# Patient Record
Sex: Female | Born: 1975 | Race: White | Hispanic: No | Marital: Married
Health system: Southern US, Community
[De-identification: ages and names within clinical notes are randomized; demographics above are authoritative.]

---

## 2008-09-17 ENCOUNTER — Ambulatory Visit: Payer: Self-pay | Admitting: Family Medicine

## 2008-09-17 DIAGNOSIS — J019 Acute sinusitis, unspecified: Secondary | ICD-10-CM | POA: Insufficient documentation

## 2017-06-18 ENCOUNTER — Emergency Department
Admission: EM | Admit: 2017-06-18 | Discharge: 2017-06-18 | Disposition: A | Discharge status: Home / Self Care | Payer: Managed Care, Other (non HMO) | Source: Home / Self Care | Attending: Family Medicine | Admitting: Family Medicine

## 2017-06-18 ENCOUNTER — Emergency Department (INDEPENDENT_AMBULATORY_CARE_PROVIDER_SITE_OTHER): Payer: Managed Care, Other (non HMO)

## 2017-06-18 DIAGNOSIS — M25571 Pain in right ankle and joints of right foot: Secondary | ICD-10-CM | POA: Diagnosis not present

## 2017-06-18 DIAGNOSIS — S93601A Unspecified sprain of right foot, initial encounter: Secondary | ICD-10-CM

## 2017-06-18 DIAGNOSIS — M79671 Pain in right foot: Secondary | ICD-10-CM

## 2017-06-18 NOTE — ED Triage Notes (Signed)
Patient presents to [facility] with a complaint of foot and ankle pain since yesterday after falling down a few stairs

## 2017-06-18 NOTE — ED Provider Notes (Signed)
[facility]    CSN: [id number] Arrival date & time: 06/18/17  1135     History   Chief Complaint Chief Complaint  Patient presents with  . Foot Pain    HPI Vanessa Dawson is a 41 y.o. female.   While walking down stairs yesterday, patient missed the bottom two stairs and came down forcefully on her right foot.  She has had persistent pain in her right forefoot.   The history is provided by the patient.  Foot Pain  This is a new problem. The current episode started yesterday. The problem occurs constantly. The problem has not changed since onset.Associated symptoms comments: none. The symptoms are aggravated by walking and standing. Nothing relieves the symptoms. She has tried nothing for the symptoms.    No past medical history on file.  Patient Active Problem List   Diagnosis Date Noted  . ACUTE SINUSITIS, UNSPECIFIED 09/17/2008    No past surgical history on file.  OB History    No data available       Home Medications    Prior to Admission medications   Not on File    Family History No family history on file.  Social History Social History  Substance Use Topics  . Smoking status: Not on file  . Smokeless tobacco: Not on file  . Alcohol use Not on file     Allergies   Acetaminophen-codeine and Sulfa antibiotics   Review of Systems Review of Systems  All other systems reviewed and are negative.    Physical Exam Triage Vital Signs ED Triage Vitals  Enc Vitals Group     BP 06/18/17 1201 96/62     Pulse Rate 06/18/17 1201 97     Resp --      Temp 06/18/17 1201 97.9 F (36.6 C)     Temp Source 06/18/17 1201 Oral     SpO2 06/18/17 1201 98 %     Weight 06/18/17 1201 195 lb (88.5 kg)     Height 06/18/17 1201 5\' 3"  (1.6 m)     Head Circumference --      Peak Flow --      Pain Score 06/18/17 1202 6     Pain Loc --      Pain Edu? --      Excl. in GC? --    No data found.   Updated Vital Signs BP 96/62 (BP Location: Left  Arm)   Pulse 97   Temp 97.9 F (36.6 C) (Oral)   Ht 5\' 3"  (1.6 m)   Wt 195 lb (88.5 kg)   SpO2 98%   BMI 34.54 kg/m   Visual Acuity Right Eye Distance:   Left Eye Distance:   Bilateral Distance:    Right Eye Near:   Left Eye Near:    Bilateral Near:     Physical Exam  Constitutional: She appears well-developed and well-nourished. No distress.  HENT:  Head: Atraumatic.  Right Ear: External ear normal.  Left Ear: External ear normal.  Eyes: Pupils are equal, round, and reactive to light. Conjunctivae are normal.  Cardiovascular: Normal rate.   Pulmonary/Chest: Effort normal.  Musculoskeletal: She exhibits no edema.       Right foot: There is decreased range of motion, tenderness and bony tenderness. There is no swelling, normal capillary refill, no crepitus, no deformity and no laceration.       Feet:  Right dorsal mid-foot has tenderness to palpation as noted on diagram.  No ankle  pain/tenderness or decreased range of motion   Neurological: She is alert.  Skin: Skin is warm and dry.  Nursing note and vitals reviewed.    UC Treatments / Results  Labs (all labs ordered are listed, but only abnormal results are displayed) Labs Reviewed - No data to display  EKG  EKG Interpretation None       Radiology Dg Ankle Complete Right  Result Date: 06/18/2017 CLINICAL DATA:  Ankle pain after injury yesterday. EXAM: RIGHT ANKLE - COMPLETE 3+ VIEW COMPARISON:  None. FINDINGS: Soft tissue swelling in the lateral right tarsal region. No fracture or subluxation. Small Achilles right calcaneal spur. No appreciable arthropathy. No suspicious focal osseous lesion. No radiopaque foreign body. IMPRESSION: No right ankle fracture or subluxation. Electronically Signed   By: Delbert Phenix M.D.   On: 06/18/2017 12:27   Dg Foot Complete Right  Result Date: 06/18/2017 CLINICAL DATA:  Right foot pain after injury yesterday EXAM: RIGHT FOOT COMPLETE - 3+ VIEW COMPARISON:  None. FINDINGS: No  fracture or dislocation. No suspicious focal osseous lesion. No appreciable arthropathy. Small Achilles right calcaneal spur. No radiopaque foreign body. IMPRESSION: No right foot fracture or malalignment. Electronically Signed   By: Delbert Phenix M.D.   On: 06/18/2017 12:25    Procedures Procedures (including critical care time)  Medications Ordered in UC Medications - No data to display   Initial Impression / Assessment and Plan / UC Course  I have reviewed the triage vital signs and the nursing notes.  Pertinent labs & imaging results that were available during my care of the patient were reviewed by me and considered in my medical decision making (see chart for details).    Ace wrap applied.  Patient declines crutches. Apply ice pack for 30 minutes every 1 to 2 hours today and tomorrow.  Elevate.  Wear Ace wrap until swelling decreases.    Begin range of motion and stretching exercises in about 5 days as per instruction sheet.  May take Ibuprofen 200mg , 4 tabs every 8 hours with food.  Followup with Dr. Rodney Langton or Dr. Clementeen Graham ([facility]) if not improving about two weeks.     Final Clinical Impressions(s) / UC Diagnoses   Final diagnoses:  Sprain of right foot, initial encounter    New Prescriptions New Prescriptions   No medications on file       Lattie Haw, MD 06/18/17 1246

## 2017-06-18 NOTE — Discharge Instructions (Signed)
Apply ice pack for 30 minutes every 1 to 2 hours today and tomorrow.  Elevate. Wear Ace wrap until swelling decreases.  Begin range of motion and stretching exercises in about 5 days as per instruction sheet.  May take Ibuprofen 200mg, 4 tabs every 8 hours with food.  °

## 2018-07-04 IMAGING — DX DG ANKLE COMPLETE 3+V*R*
3 series · 3 of 3 positions shown · non-contrast
Comparison: None.

CLINICAL DATA: Ankle pain after injury yesterday.

EXAM:
RIGHT ANKLE - COMPLETE 3+ VIEW

[ankle ap]
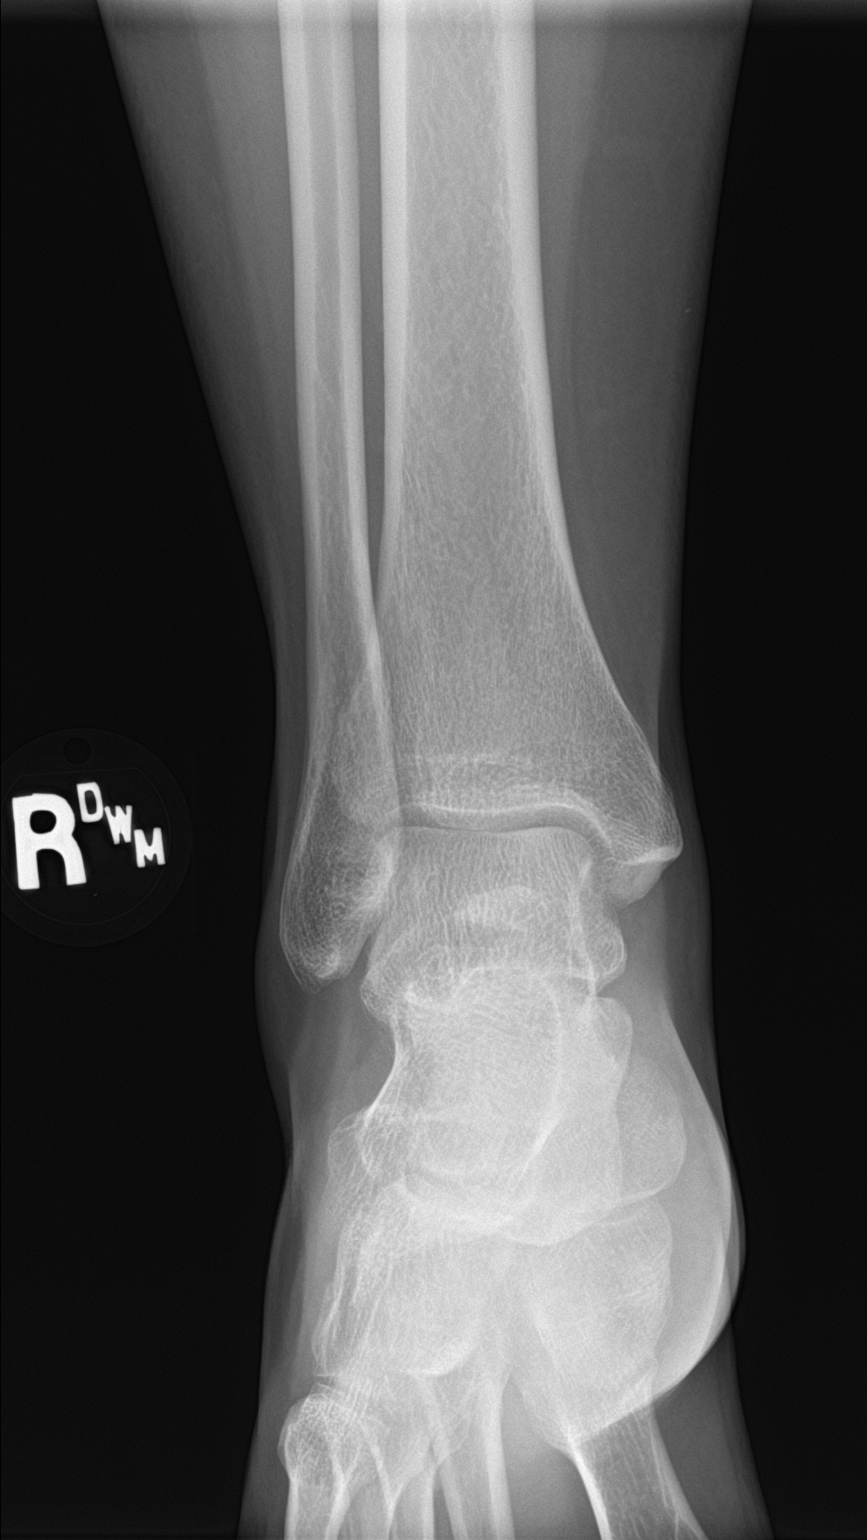

[ankle obl]
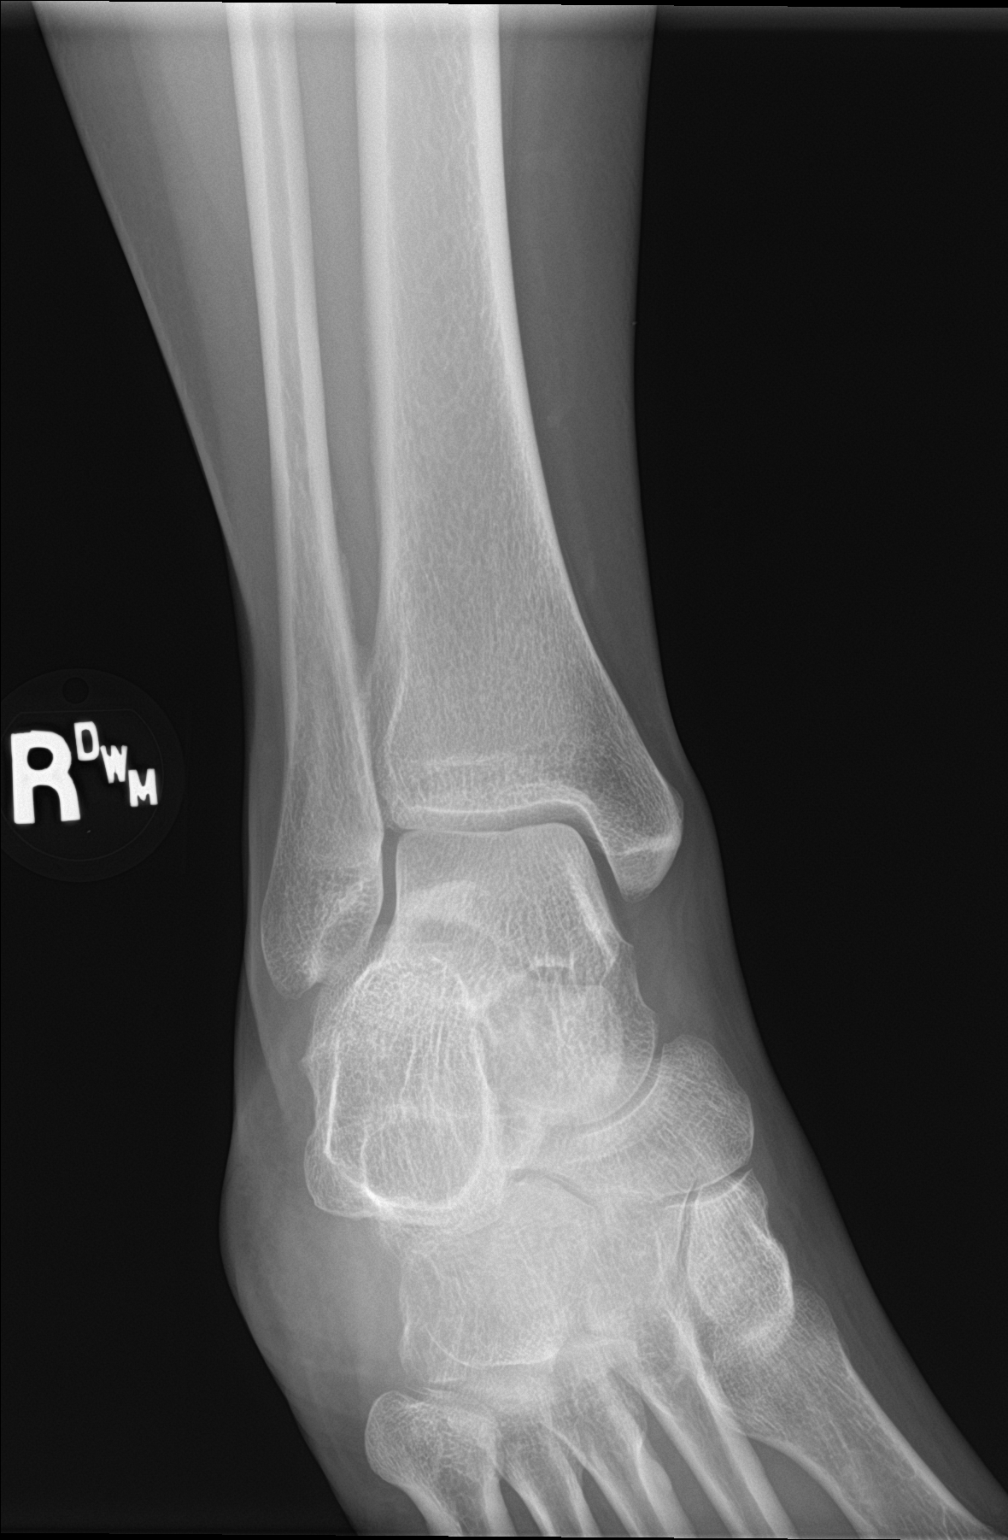

[ankle lat]
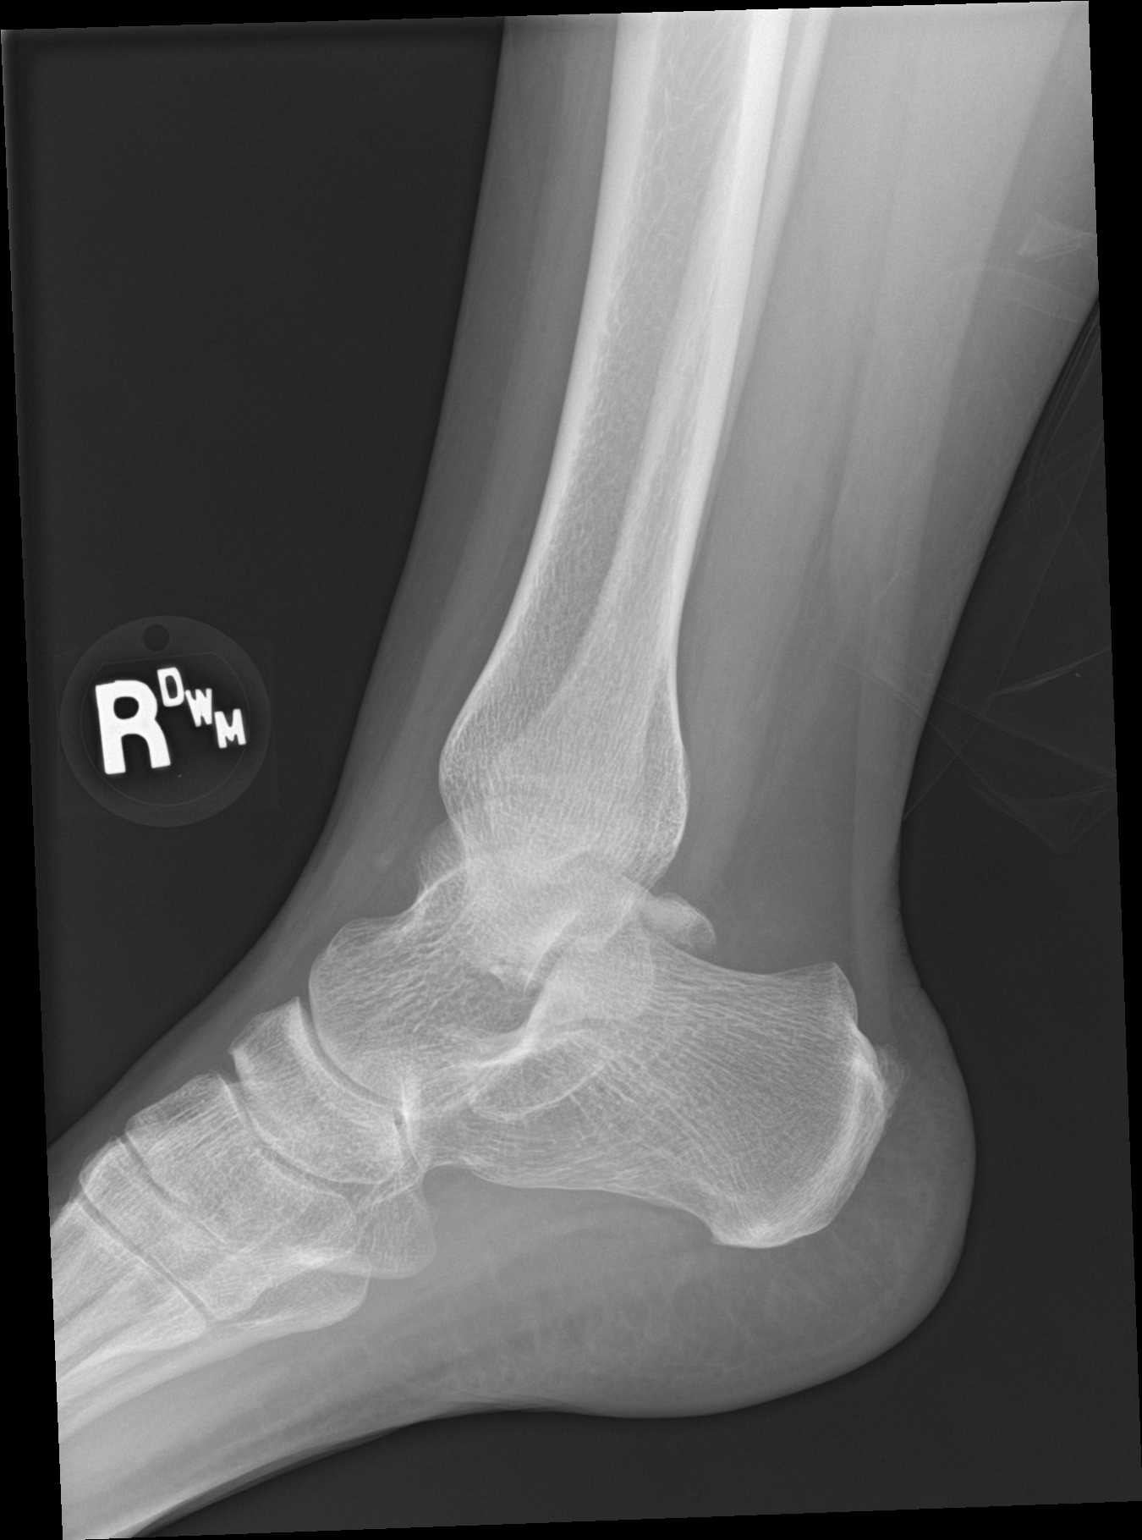

[3 of 3 positions shown; findings below may reference images not displayed]

FINDINGS: Soft tissue swelling in the lateral right tarsal region. No fracture
or subluxation. Small Achilles right calcaneal spur. No appreciable
arthropathy. No suspicious focal osseous lesion. No radiopaque
foreign body.
IMPRESSION: No right ankle fracture or subluxation.

## 2018-07-04 IMAGING — DX DG FOOT COMPLETE 3+V*R*
3 series · 3 of 3 positions shown · non-contrast
Comparison: None.

CLINICAL DATA: Right foot pain after injury yesterday

EXAM:
RIGHT FOOT COMPLETE - 3+ VIEW

[foot ap]
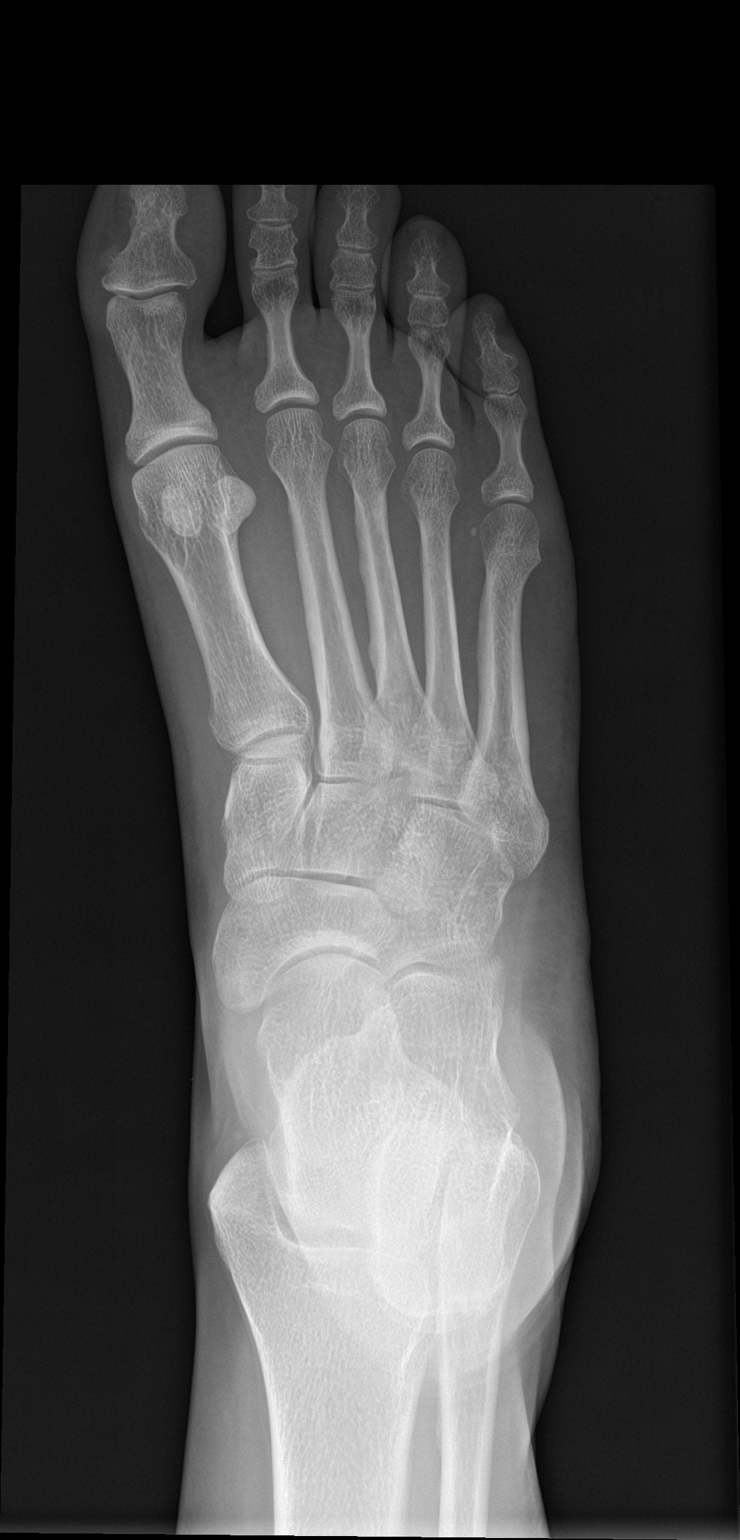

[foot obl]
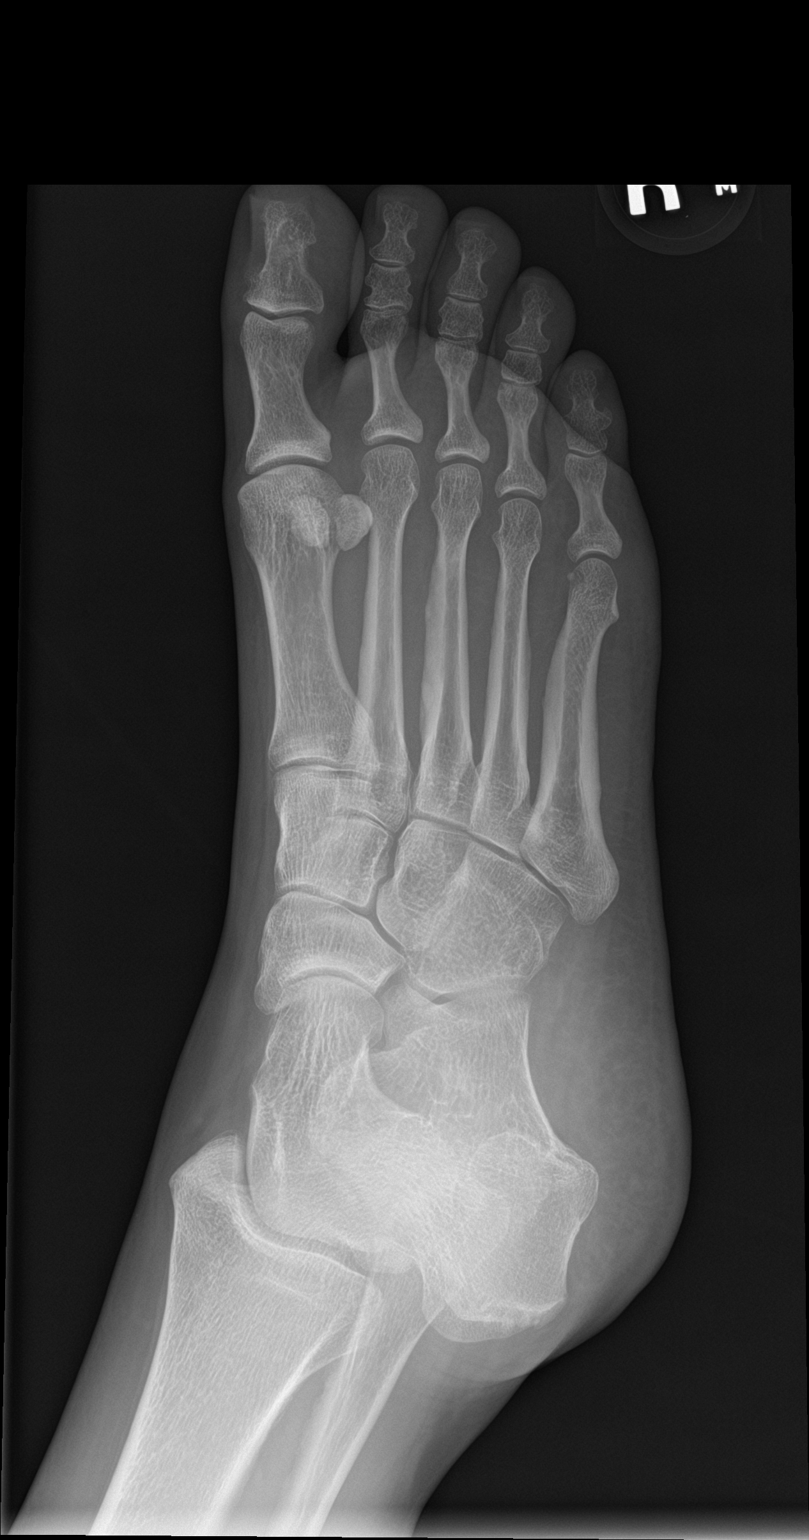

[foot lat]
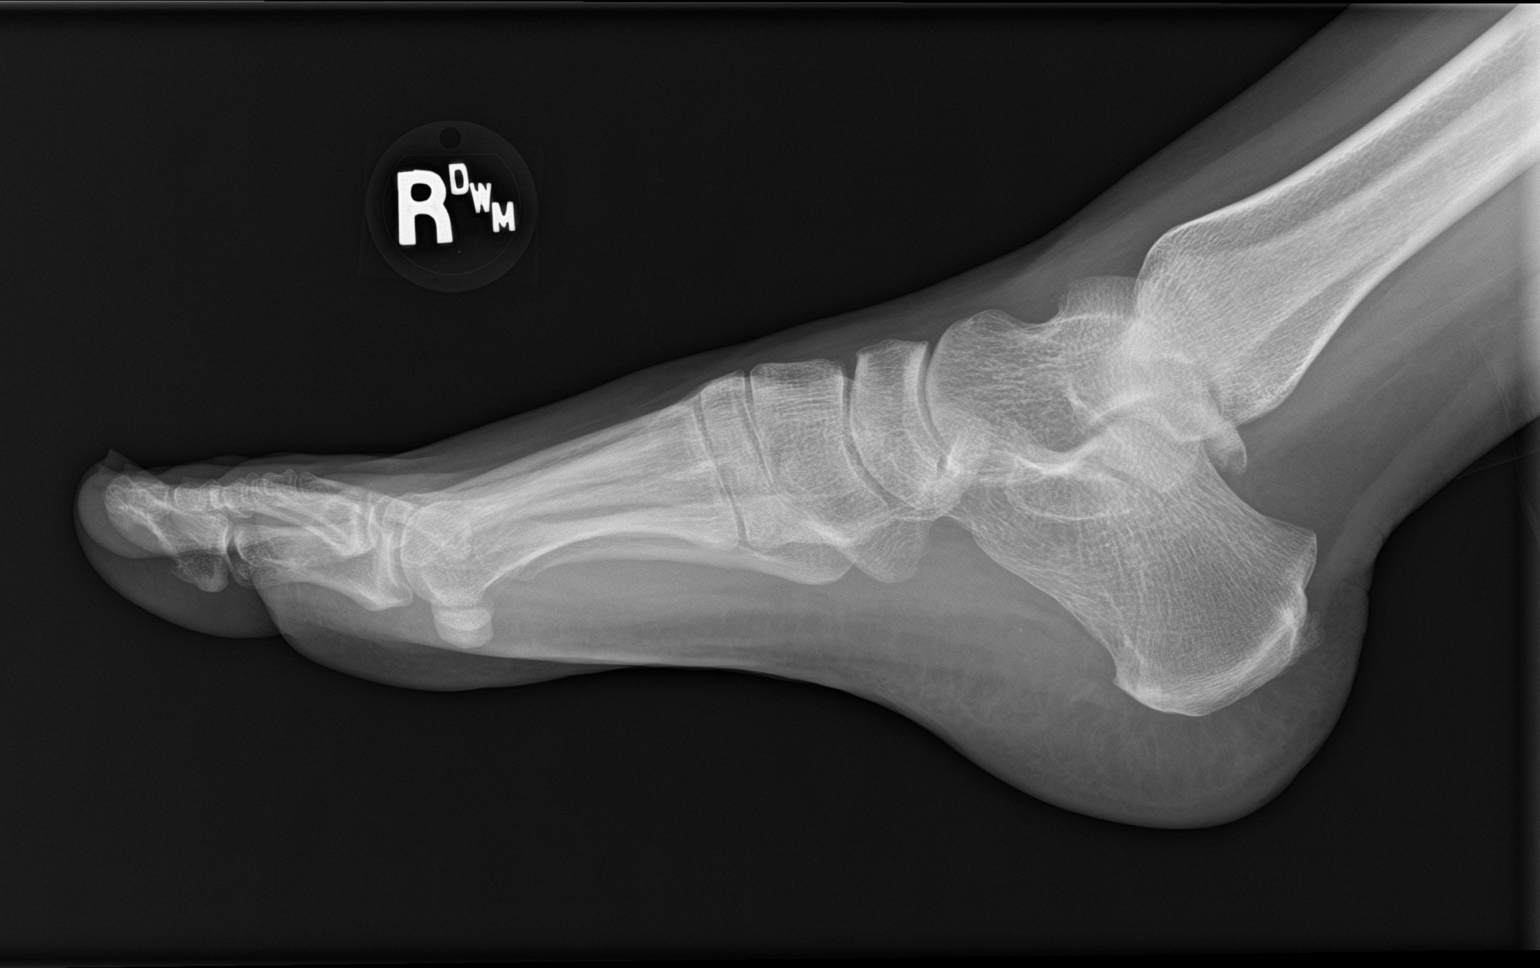

[3 of 3 positions shown; findings below may reference images not displayed]

FINDINGS: No fracture or dislocation. No suspicious focal osseous lesion. No
appreciable arthropathy. Small Achilles right calcaneal spur. No
radiopaque foreign body.
IMPRESSION: No right foot fracture or malalignment.
